# Patient Record
Sex: Female | Born: 2005 | Hispanic: No | Marital: Single | Smoking: Never smoker
Health system: Southern US, Community
[De-identification: ages and names within clinical notes are randomized; demographics above are authoritative.]

## PROBLEM LIST (undated history)

## (undated) ENCOUNTER — Emergency Department: Admission: EM | Payer: MEDICAID

## (undated) DIAGNOSIS — F913 Oppositional defiant disorder: Secondary | ICD-10-CM

## (undated) DIAGNOSIS — F32A Depression, unspecified: Secondary | ICD-10-CM

## (undated) DIAGNOSIS — F419 Anxiety disorder, unspecified: Secondary | ICD-10-CM

## (undated) DIAGNOSIS — F909 Attention-deficit hyperactivity disorder, unspecified type: Secondary | ICD-10-CM

## (undated) DIAGNOSIS — F329 Major depressive disorder, single episode, unspecified: Secondary | ICD-10-CM

---
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
## 2024-01-01 ENCOUNTER — Ambulatory Visit: Payer: MEDICAID | Admitting: Family Medicine

## 2024-01-01 NOTE — Progress Notes (Deleted)
 Established patient visit   Patient: Molly Maldonado   DOB: Aug 17, 2005   18 y.o. Female  MRN: [id number] Visit Date: 01/01/2024  Today's healthcare provider: Mimi Alt, MD   No chief complaint on file.  Subjective       Discussed the use of AI scribe software for clinical note transcription with the patient, who gave verbal consent to proceed.  History of Present Illness      Past Medical History:  Diagnosis Date   ADD (attention deficit disorder with hyperactivity)    Anxiety    Depression    Oppositional disorder     Medications: Outpatient Medications Prior to Visit  Medication Sig   buPROPion  (WELLBUTRIN  XL) 150 MG 24 hr tablet Take 150 mg by mouth daily.   divalproex  (DEPAKOTE  ER) 500 MG 24 hr tablet Take 500 mg by mouth 2 (two) times daily.   folic acid  (FOLVITE ) 800 MCG tablet Take 800 mcg by mouth daily.   guanFACINE  (TENEX ) 1 MG tablet Take 1 tablet (1 mg total) by mouth daily.   hydrOXYzine  (VISTARIL ) 50 MG capsule Take 1 capsule (50 mg total) by mouth 3 (three) times daily as needed.   loratadine  (CLARITIN ) 10 MG tablet Take 1 tablet (10 mg total) by mouth daily as needed for allergies.   medroxyPROGESTERone  (DEPO-PROVERA ) 150 MG/ML injection Inject 150 mg into the muscle every 3 (three) months.   melatonin 5 MG TABS Take 10 mg by mouth at bedtime.   prazosin  (MINIPRESS ) 5 MG capsule Take 1 capsule (5 mg total) by mouth at bedtime.   traZODone  (DESYREL ) 100 MG tablet Take 1 tablet (100 mg total) by mouth at bedtime.   ziprasidone  (GEODON ) 80 MG capsule Take 1 capsule (80 mg total) by mouth daily. Take with a snack or meal.   Facility-Administered Medications Prior to Visit  Medication Dose Route Frequency Provider   medroxyPROGESTERone  Acetate SUSY   Intramuscular Q90 days Simmons-Robinson, Judyann Number, MD    Review of Systems  Last metabolic panel Lab Results  Component Value Date   GLUCOSE 91 12/30/2023   NA 138 12/30/2023   K 3.9  12/30/2023   CL 106 12/30/2023   CO2 21 (L) 12/30/2023   BUN 10 12/30/2023   CREATININE 0.69 12/30/2023   GFRNONAA >60 12/30/2023   CALCIUM 9.3 12/30/2023   PROT 7.7 12/30/2023   ALBUMIN 4.3 12/30/2023   LABGLOB 2.8 10/22/2023   BILITOT 0.9 12/30/2023   ALKPHOS 50 12/30/2023   AST 15 12/30/2023   ALT 15 12/30/2023   ANIONGAP 11 12/30/2023   Last lipids Lab Results  Component Value Date   CHOL 150 10/22/2023   HDL 56 10/22/2023   LDLCALC 81 10/22/2023   TRIG 63 10/22/2023   CHOLHDL 2.7 10/22/2023   Last hemoglobin A1c Lab Results  Component Value Date   HGBA1C 5.1 10/22/2023   Last thyroid  functions Lab Results  Component Value Date   TSH 1.000 10/22/2023   T4TOTAL 7.2 11/08/2014   Last vitamin D No results found for: "25OHVITD2", "25OHVITD3", "VD25OH" Last vitamin B12 and Folate No results found for: "VITAMINB12", "FOLATE"   {See past labs  Heme  Chem  Endocrine  Serology  Results Review (optional):1}   Objective    There were no vitals taken for this visit. BP Readings from Last 3 Encounters:  12/31/23 133/77  10/22/23 97/74  07/09/23 124/76   Wt Readings from Last 3 Encounters:  12/30/23 231 lb 7.7 oz (105 kg) (99%, Z=  2.32)*  10/22/23 232 lb (105.2 kg) (99%, Z= 2.32)*  07/06/23 245 lb (111.1 kg) (>99%, Z= 2.42)*   * Growth percentiles are based on CDC (Girls, 2-20 Years) data.    {See vitals history (optional):1}    Physical Exam  ***  No results found for any visits on 01/01/24.  Assessment & Plan     Problem List Items Addressed This Visit       Other   Stress reaction causing mixed disturbance of emotion and conduct   ADHD (attention deficit hyperactivity disorder), predominantly hyperactive impulsive type - Primary    Assessment and Plan Assessment & Plan      No follow-ups on file.         Mimi Alt, MD  [facility] [phone] (phone) [phone] (fax)  [facility]

## 2024-01-22 ENCOUNTER — Ambulatory Visit: Payer: MEDICAID | Admitting: Family Medicine

## 2024-02-06 ENCOUNTER — Ambulatory Visit: Payer: MEDICAID | Admitting: Physician Assistant

## 2024-02-06 NOTE — Progress Notes (Deleted)
 Established patient visit  Patient: Molly Maldonado   DOB: Mar 14, 2006   18 y.o. Female  MRN: [id number] Visit Date: 02/06/2024  Today's healthcare provider: Jolynn Spencer, PA-C   No chief complaint on file.  Subjective       Discussed the use of AI scribe software for clinical note transcription with the patient, who gave verbal consent to proceed.  History of Present Illness        10/22/2023    2:47 PM  Depression screen PHQ 2/9  Decreased Interest 0  Down, Depressed, Hopeless 0  PHQ - 2 Score 0  Altered sleeping 0  Tired, decreased energy 0  Change in appetite 3  Feeling bad or failure about yourself  1  Trouble concentrating 3  Moving slowly or fidgety/restless 1  Suicidal thoughts 0  PHQ-9 Score 8  Difficult doing work/chores Not difficult at all      10/22/2023    2:47 PM  GAD 7 : Generalized Anxiety Score  Nervous, Anxious, on Edge 1  Control/stop worrying 2  Worry too much - different things 2  Trouble relaxing 2  Restless 2  Easily annoyed or irritable 0  Afraid - awful might happen 0  Total GAD 7 Score 9  Anxiety Difficulty Not difficult at all    Medications: Outpatient Medications Prior to Visit  Medication Sig  . buPROPion  (WELLBUTRIN  XL) 150 MG 24 hr tablet Take 150 mg by mouth daily.  . divalproex  (DEPAKOTE  ER) 500 MG 24 hr tablet Take 500 mg by mouth 2 (two) times daily.  . folic acid  (FOLVITE ) 800 MCG tablet Take 800 mcg by mouth daily.  . guanFACINE  (TENEX ) 1 MG tablet Take 1 tablet (1 mg total) by mouth daily.  . hydrOXYzine  (VISTARIL ) 50 MG capsule Take 1 capsule (50 mg total) by mouth 3 (three) times daily as needed.  . loratadine  (CLARITIN ) 10 MG tablet Take 1 tablet (10 mg total) by mouth daily as needed for allergies.  . medroxyPROGESTERone  (DEPO-PROVERA ) 150 MG/ML injection Inject 150 mg into the muscle every 3 (three) months.  . melatonin 5 MG TABS Take 10 mg by mouth at bedtime.  . prazosin  (MINIPRESS ) 5 MG capsule Take 1 capsule (5  mg total) by mouth at bedtime.  . traZODone  (DESYREL ) 100 MG tablet Take 1 tablet (100 mg total) by mouth at bedtime.  . ziprasidone  (GEODON ) 80 MG capsule Take 1 capsule (80 mg total) by mouth daily. Take with a snack or meal.   Facility-Administered Medications Prior to Visit  Medication Dose Route Frequency Provider  . medroxyPROGESTERone  Acetate SUSY   Intramuscular Q90 days Simmons-Robinson, Makiera, MD    Review of Systems  All other systems reviewed and are negative. All negative Except see HPI   {Insert previous labs (optional):23779} {See past labs  Heme  Chem  Endocrine  Serology  Results Review (optional):1}   Objective    There were no vitals taken for this visit. {Insert last BP/Wt (optional):23777}{See vitals history (optional):1}   Physical Exam Vitals reviewed.  Constitutional:      General: She is not in acute distress.    Appearance: Normal appearance. She is well-developed. She is not diaphoretic.  HENT:     Head: Normocephalic and atraumatic.   Eyes:     General: No scleral icterus.    Conjunctiva/sclera: Conjunctivae normal.   Neck:     Thyroid : No thyromegaly.   Cardiovascular:     Rate and Rhythm: Normal rate and regular rhythm.  Pulses: Normal pulses.     Heart sounds: Normal heart sounds. No murmur heard. Pulmonary:     Effort: Pulmonary effort is normal. No respiratory distress.     Breath sounds: Normal breath sounds. No wheezing, rhonchi or rales.   Musculoskeletal:     Cervical back: Neck supple.     Right lower leg: No edema.     Left lower leg: No edema.  Lymphadenopathy:     Cervical: No cervical adenopathy.   Skin:    General: Skin is warm and dry.     Findings: No rash.   Neurological:     Mental Status: She is alert and oriented to person, place, and time. Mental status is at baseline.   Psychiatric:        Mood and Affect: Mood normal.        Behavior: Behavior normal.     No results found for any visits on  02/06/24.      Assessment and Plan Assessment & Plan     No orders of the defined types were placed in this encounter.   No follow-ups on file.   The patient was advised to call back or seek an in-person evaluation if the symptoms worsen or if the condition fails to improve as anticipated.  I discussed the assessment and treatment plan with the patient. The patient was provided an opportunity to ask questions and all were answered. The patient agreed with the plan and demonstrated an understanding of the instructions.  I, Nery Frappier, PA-C have reviewed all documentation for this visit. The documentation on 02/06/2024  for the exam, diagnosis, procedures, and orders are all accurate and complete.  Jolynn Spencer, [facility], MMS [facility] [phone] (phone) [phone] (fax)  [facility]

## 2024-04-21 ENCOUNTER — Ambulatory Visit (INDEPENDENT_AMBULATORY_CARE_PROVIDER_SITE_OTHER): Payer: MEDICAID | Admitting: Family Medicine

## 2024-04-21 ENCOUNTER — Encounter: Payer: Self-pay | Admitting: Family Medicine

## 2024-04-21 ENCOUNTER — Other Ambulatory Visit (HOSPITAL_COMMUNITY)
Admission: RE | Admit: 2024-04-21 | Discharge: 2024-04-21 | Disposition: A | Discharge status: Home / Self Care | Payer: MEDICAID | Source: Ambulatory Visit | Attending: Family Medicine | Admitting: Family Medicine

## 2024-04-21 VITALS — BP 108/70 | HR 89 | Temp 98.4°F | Ht 69.0 in | Wt 227.4 lb

## 2024-04-21 DIAGNOSIS — R443 Hallucinations, unspecified: Secondary | ICD-10-CM

## 2024-04-21 DIAGNOSIS — F901 Attention-deficit hyperactivity disorder, predominantly hyperactive type: Secondary | ICD-10-CM

## 2024-04-21 DIAGNOSIS — Z6833 Body mass index (BMI) 33.0-33.9, adult: Secondary | ICD-10-CM

## 2024-04-21 DIAGNOSIS — F430 Acute stress reaction: Secondary | ICD-10-CM

## 2024-04-21 DIAGNOSIS — Z9189 Other specified personal risk factors, not elsewhere classified: Secondary | ICD-10-CM

## 2024-04-21 DIAGNOSIS — E66811 Obesity, class 1: Secondary | ICD-10-CM | POA: Insufficient documentation

## 2024-04-21 DIAGNOSIS — F3481 Disruptive mood dysregulation disorder: Secondary | ICD-10-CM

## 2024-04-21 DIAGNOSIS — J3089 Other allergic rhinitis: Secondary | ICD-10-CM

## 2024-04-21 DIAGNOSIS — Z30013 Encounter for initial prescription of injectable contraceptive: Secondary | ICD-10-CM | POA: Diagnosis not present

## 2024-04-21 DIAGNOSIS — F5104 Psychophysiologic insomnia: Secondary | ICD-10-CM

## 2024-04-21 DIAGNOSIS — Z111 Encounter for screening for respiratory tuberculosis: Secondary | ICD-10-CM

## 2024-04-21 LAB — POCT URINE PREGNANCY: Preg Test, Ur: NEGATIVE

## 2024-04-21 MED ORDER — BUPROPION HCL ER (XL) 150 MG PO TB24: 150.0000 mg | ORAL_TABLET | Freq: Every day | ORAL | 2 refills | Status: DC

## 2024-04-21 MED ORDER — ZIPRASIDONE HCL 80 MG PO CAPS: 80.0000 mg | ORAL_CAPSULE | Freq: Every day | ORAL | 0 refills | Status: AC

## 2024-04-21 MED ORDER — HYDROXYZINE PAMOATE 50 MG PO CAPS
50.0000 mg | ORAL_CAPSULE | Freq: Three times a day (TID) | ORAL | 0 refills | Status: AC | PRN
Start: 2024-04-21 — End: ?

## 2024-04-21 MED ORDER — GUANFACINE HCL 1 MG PO TABS: 1.0000 mg | ORAL_TABLET | Freq: Every day | ORAL | 0 refills | Status: DC

## 2024-04-21 MED ORDER — TRAZODONE HCL 100 MG PO TABS: 100.0000 mg | ORAL_TABLET | Freq: Every day | ORAL | 0 refills | Status: DC

## 2024-04-21 MED ORDER — LORATADINE 10 MG PO TABS: 10.0000 mg | ORAL_TABLET | Freq: Every day | ORAL | 3 refills | Status: AC | PRN

## 2024-04-21 MED ORDER — DIVALPROEX SODIUM ER 500 MG PO TB24: 500.0000 mg | ORAL_TABLET | Freq: Two times a day (BID) | ORAL | 2 refills | Status: DC

## 2024-04-21 MED ORDER — PRAZOSIN HCL 5 MG PO CAPS: 5.0000 mg | ORAL_CAPSULE | Freq: Every day | ORAL | 0 refills | Status: DC

## 2024-04-21 NOTE — Patient Instructions (Signed)
 To keep you healthy, please keep in mind the following health maintenance items that you are due for:   Health Maintenance Due  Topic Date Due   HPV VACCINES (1 - 3-dose series) Never done   Meningococcal B Vaccine (1 of 2 - Standard) Never done   DTaP/Tdap/Td (1 - Tdap) Never done     Best Wishes,   Dr. Lang

## 2024-04-21 NOTE — Progress Notes (Signed)
 Established patient visit   Patient: Molly Maldonado   DOB: Jul 08, 2006   18 y.o. Female  MRN: [id number] Visit Date: 04/21/2024  Today's healthcare provider: Rockie Agent, MD   Chief Complaint  Patient presents with   Medication Refill    Patient is needing refills on all medications.     Contraception    Reports that since last depo injection, patient has not gotten her period since. Patient is wanting to restart depo, last inj in March. Needs urine preg Requesting testing for all STI (self swab and blood testing, HIV and HSV 2)   Form Completion    Requesting FL2, advised patient that since she has a lot of concerns we may need to prioritize, would like to discuss med refills and depo/ std and sti testing primarily.    Subjective     HPI     Medication Refill    Additional comments: Patient is needing refills on all medications.          Contraception    Additional comments: Reports that since last depo injection, patient has not gotten her period since. Patient is wanting to restart depo, last inj in March. Needs urine preg Requesting testing for all STI (self swab and blood testing, HIV and HSV 2)        Form Completion    Additional comments: Requesting FL2, advised patient that since she has a lot of concerns we may need to prioritize, would like to discuss med refills and depo/ std and sti testing primarily.       Last edited by Cherry Chiquita HERO, CMA on 04/21/2024 10:13 AM.       Discussed the use of AI scribe software for clinical note transcription with the patient, who gave verbal consent to proceed.  History of Present Illness Molly Maldonado is a 18 year old female with ADHD and disruptive mood dysregulation disorder who presents for chronic follow-up for medication management and requires a psychiatry referral.  She was evaluated in the emergency department for an acute stress reaction on July 6th at [facility], where she was assessed  by the psychiatry emergency service treatment team and discharged home with instructions to establish care with an outpatient behavioral health provider. She has a history of ADHD, disruptive mood dysregulation disorder, hallucinations, and stress reaction causing mixed disturbance of emotion and conduct. In May, she experienced auditory hallucinations and was brought in by the police department on an involuntary commitment due to concerns of shoplifting and expressing suicidal ideations.  Her current medications include Wellbutrin  150 mg daily, Depakote  500 mg twice daily (500 mg in the morning and 1500 mg at night), guanfacine  1 mg daily for ADHD, Vistaril  50 mg three times daily as needed for anxiety, Claritin  10 mg daily for allergic rhinitis, Depo-Provera  for contraception, prazosin  5 mg at bedtime for sleep, trazodone  100 mg at bedtime for sleep, and ziprasidone  80 mg daily with a meal. There is uncertainty about her adherence to these medications as she has not been filled since June. She has not taken her medications for a while and feels she has been 'practically fine' without them. She mentions being more focused and awake since stopping the medications. She is able to sleep without medication, using melatonin instead, although she takes a high dose to achieve her usual effect.  She has not had a menstrual period since her last Depo-Provera  injection six months ago. She is interested in restarting Depo-Provera  and  will complete a urine pregnancy test as part of this process.  She lives in a group home setting but has transitioned to living in an [facility] ([facility]) arrangement, where she resides in a house with 24-hour support. She is not currently engaged in work or school activities but attends a program during the day. No current suicidal ideation, self-harm behaviors, auditory or visual hallucinations, and reports improved focus and wakefulness since discontinuing her  medications.     Past Medical History:  Diagnosis Date   ADD (attention deficit disorder with hyperactivity)    Anxiety    Depression    Oppositional disorder     Medications: Outpatient Medications Prior to Visit  Medication Sig   folic acid  (FOLVITE ) 800 MCG tablet Take 800 mcg by mouth daily.   medroxyPROGESTERone  (DEPO-PROVERA ) 150 MG/ML injection Inject 150 mg into the muscle every 3 (three) months.   melatonin 5 MG TABS Take 10 mg by mouth at bedtime.   [DISCONTINUED] buPROPion  (WELLBUTRIN  XL) 150 MG 24 hr tablet Take 150 mg by mouth daily.   [DISCONTINUED] divalproex  (DEPAKOTE  ER) 500 MG 24 hr tablet Take 500 mg by mouth 2 (two) times daily.   [DISCONTINUED] guanFACINE  (TENEX ) 1 MG tablet Take 1 tablet (1 mg total) by mouth daily.   [DISCONTINUED] hydrOXYzine  (VISTARIL ) 50 MG capsule Take 1 capsule (50 mg total) by mouth 3 (three) times daily as needed.   [DISCONTINUED] loratadine  (CLARITIN ) 10 MG tablet Take 1 tablet (10 mg total) by mouth daily as needed for allergies.   [DISCONTINUED] prazosin  (MINIPRESS ) 5 MG capsule Take 1 capsule (5 mg total) by mouth at bedtime.   [DISCONTINUED] traZODone  (DESYREL ) 100 MG tablet Take 1 tablet (100 mg total) by mouth at bedtime.   [DISCONTINUED] ziprasidone  (GEODON ) 80 MG capsule Take 1 capsule (80 mg total) by mouth daily. Take with a snack or meal.   Facility-Administered Medications Prior to Visit  Medication Dose Route Frequency Provider   medroxyPROGESTERone  Acetate SUSY   Intramuscular Q90 days Simmons-Robinson, Rockie, MD    Review of Systems  Last metabolic panel Lab Results  Component Value Date   GLUCOSE 91 12/30/2023   NA 138 12/30/2023   K 3.9 12/30/2023   CL 106 12/30/2023   CO2 21 (L) 12/30/2023   BUN 10 12/30/2023   CREATININE 0.69 12/30/2023   GFRNONAA >60 12/30/2023   CALCIUM 9.3 12/30/2023   PROT 7.7 12/30/2023   ALBUMIN 4.3 12/30/2023   LABGLOB 2.8 10/22/2023   BILITOT 0.9 12/30/2023   ALKPHOS 50  12/30/2023   AST 15 12/30/2023   ALT 15 12/30/2023   ANIONGAP 11 12/30/2023   Last lipids Lab Results  Component Value Date   CHOL 150 10/22/2023   HDL 56 10/22/2023   LDLCALC 81 10/22/2023   TRIG 63 10/22/2023   CHOLHDL 2.7 10/22/2023   Last hemoglobin A1c Lab Results  Component Value Date   HGBA1C 5.1 10/22/2023   Last thyroid  functions Lab Results  Component Value Date   TSH 1.000 10/22/2023   T4TOTAL 7.2 11/08/2014     \   Objective    BP 108/70 (BP Location: Left Arm, Patient Position: Sitting, Cuff Size: Normal)   Pulse 89   Temp 98.4 F (36.9 C) (Oral)   Ht 5' 9 (1.753 m)   Wt 227 lb 6.4 oz (103.1 kg)   SpO2 98%   BMI 33.58 kg/m  BP Readings from Last 3 Encounters:  04/21/24 108/70  12/31/23 133/77  10/22/23 97/74  Wt Readings from Last 3 Encounters:  04/21/24 227 lb 6.4 oz (103.1 kg) (99%, Z= 2.28)*  12/30/23 231 lb 7.7 oz (105 kg) (99%, Z= 2.32)*  10/22/23 232 lb (105.2 kg) (99%, Z= 2.32)*   * Growth percentiles are based on CDC (Girls, 2-20 Years) data.        Physical Exam Vitals reviewed.  Constitutional:      General: She is not in acute distress.    Appearance: Normal appearance. She is not ill-appearing.  Cardiovascular:     Rate and Rhythm: Normal rate and regular rhythm.  Pulmonary:     Effort: Pulmonary effort is normal. No respiratory distress.     Breath sounds: No wheezing, rhonchi or rales.  Neurological:     Mental Status: She is alert and oriented to person, place, and time.  Psychiatric:        Attention and Perception: She is inattentive.        Mood and Affect: Mood normal. Affect is not flat.        Speech: Speech normal.        Behavior: Behavior is cooperative.        Thought Content: Thought content does not include suicidal ideation. Thought content does not include suicidal plan.     Comments: Decreased eye contact        No results found for any visits on 04/21/24.  Assessment & Plan     Problem List  Items Addressed This Visit     ADHD (attention deficit hyperactivity disorder), predominantly hyperactive impulsive type - Primary   Relevant Medications   guanFACINE  (TENEX ) 1 MG tablet   Other Relevant Orders   Ambulatory referral to Psychiatry   Class 1 obesity without serious comorbidity with body mass index (BMI) of 33.0 to 33.9 in adult   Relevant Orders   TSH + free T4   CMP14+EGFR   Hemoglobin A1c   Vitamin B12   Lipid panel   DMDD (disruptive mood dysregulation disorder) (HCC)   Relevant Medications   ziprasidone  (GEODON ) 80 MG capsule   hydrOXYzine  (VISTARIL ) 50 MG capsule   divalproex  (DEPAKOTE  ER) 500 MG 24 hr tablet   buPROPion  (WELLBUTRIN  XL) 150 MG 24 hr tablet   Hallucinations   Stress reaction causing mixed disturbance of emotion and conduct   Relevant Medications   traZODone  (DESYREL ) 100 MG tablet   hydrOXYzine  (VISTARIL ) 50 MG capsule   buPROPion  (WELLBUTRIN  XL) 150 MG 24 hr tablet   Other Relevant Orders   Ambulatory referral to Psychiatry   Other Visit Diagnoses       Encounter for initial prescription of injectable contraceptive       Relevant Orders   POCT urine pregnancy     Screening-pulmonary TB       Relevant Orders   QuantiFERON-TB Gold Plus     History of sexual behavior with high risk of exposure to communicable disease       Relevant Orders   HIV antibody (with reflex)   POCT urine pregnancy   RPR   Cervicovaginal ancillary only     Seasonal allergic rhinitis due to other allergic trigger       Relevant Medications   loratadine  (CLARITIN ) 10 MG tablet     Psychophysiological insomnia       Relevant Medications   traZODone  (DESYREL ) 100 MG tablet   prazosin  (MINIPRESS ) 5 MG capsule       Assessment and Plan Assessment & Plan Chronic psychiatric conditions including ADHD,  disruptive mood dysregulation disorder, hallucinations, and chronic suicidal ideation Evaluated in the ED for acute stress reaction in July. No current suicidal  ideation or hallucinations. Not taking prescribed psychiatric medications consistently. Expressed feeling fine without medications. Needs urgent psychiatric referral for medication management and behavioral health support. - Submit urgent referral to psychiatry - Refill medications: ziprasidone , trazodone , prazosin , Claritin , hydroxyzine , guanfacine   Contraceptive management with Depo-Provera  Last injection was in March, and she has not had a period since. Interested in restarting Depo-Provera . Needs a negative pregnancy test before restarting. - Perform urine pregnancy test - Restart Depo-Provera  injections upon negative pregnancy test  Obesity, class 1 Class 1 obesity with a BMI on the higher side. Weight has decreased from 232 lbs in March to 227 lbs currently. Discussed dietary habits, including high sugar intake. Plan to monitor weight and metabolic health. - Check metabolic panel, A1c, B12, and lipid levels - Monitor weight and dietary habits  Allergic rhinitis Symptoms of itchy, watery eyes, especially during seasonal changes. No recent flare-ups reported. - Refill Claritin  10 mg daily    After conclusion of visit, patient declines Depo injection, states that she would rather have her menses and DOES NOT want to be on Depo   Return in about 3 months (around 07/21/2024) for Chronic F/U.         Rockie Agent, MD  [facility] [phone] (phone) [phone] (fax)  [facility]

## 2024-04-22 ENCOUNTER — Ambulatory Visit: Payer: Self-pay | Admitting: Family Medicine

## 2024-04-22 LAB — CERVICOVAGINAL ANCILLARY ONLY
Bacterial Vaginitis (gardnerella): NEGATIVE
Candida Glabrata: NEGATIVE
Candida Vaginitis: NEGATIVE
Chlamydia: NEGATIVE
Comment: NEGATIVE
Comment: NEGATIVE
Comment: NEGATIVE
Comment: NEGATIVE
Comment: NEGATIVE
Comment: NORMAL
Neisseria Gonorrhea: NEGATIVE
Trichomonas: NEGATIVE

## 2024-04-23 ENCOUNTER — Telehealth: Payer: Self-pay

## 2024-04-23 DIAGNOSIS — F3481 Disruptive mood dysregulation disorder: Secondary | ICD-10-CM

## 2024-04-23 DIAGNOSIS — F5104 Psychophysiologic insomnia: Secondary | ICD-10-CM

## 2024-04-23 NOTE — Telephone Encounter (Signed)
 Copied from CRM #[id number]. Topic: Clinical - Medication Question >> Apr 23, 2024  1:13 PM DeAngela L wrote: Reason for CRM:  Adilya calling from the group home the patient lives at to ask the doctor if the patient is still going to be taking folic acid  (FOLVITE ) 800 MCG tablet and also melatonin 5 MG TABS If the patient is still going to take these meds they will need a prescription for them since she is in a group home  Or if the patient was removed from taking these meds the need this confirmed for records   Pt num [phone]

## 2024-04-24 LAB — QUANTIFERON-TB GOLD PLUS
QuantiFERON Mitogen Value: >10 [IU]/mL
QuantiFERON Nil Value: 0.04 [IU]/mL
QuantiFERON TB1 Ag Value: 0.04 [IU]/mL
QuantiFERON TB2 Ag Value: 0.03 [IU]/mL

## 2024-04-24 LAB — LIPID PANEL
Chol/HDL Ratio: 3.1 ratio (ref 0.0–4.4)
Cholesterol, Total: 160 mg/dL (ref 100–169)
HDL: 52 mg/dL (ref >39)
LDL Chol Calc (NIH): 88 mg/dL (ref 0–109)
Triglycerides: 112 mg/dL — ABNORMAL HIGH (ref 0–89)
VLDL Cholesterol Cal: 20 mg/dL (ref 5–40)

## 2024-04-24 LAB — CMP14+EGFR
ALT: 18 IU/L (ref 0–32)
AST: 15 IU/L (ref 0–40)
Albumin: 4.5 g/dL (ref 4.0–5.0)
Alkaline Phosphatase: 88 IU/L (ref 42–106)
BUN/Creatinine Ratio: 15 (ref 9–23)
BUN: 9 mg/dL (ref 6–20)
Bilirubin Total: 0.7 mg/dL (ref 0.0–1.2)
CO2: 20 mmol/L (ref 20–29)
Calcium: 9.3 mg/dL (ref 8.7–10.2)
Chloride: 103 mmol/L (ref 96–106)
Creatinine, Ser: 0.6 mg/dL (ref 0.57–1.00)
Globulin, Total: 2.9 g/dL (ref 1.5–4.5)
Glucose: 84 mg/dL (ref 70–99)
Potassium: 4.4 mmol/L (ref 3.5–5.2)
Sodium: 136 mmol/L (ref 134–144)
Total Protein: 7.4 g/dL (ref 6.0–8.5)
eGFR: 133 mL/min/1.73 (ref >59)

## 2024-04-24 LAB — TSH+FREE T4
Free T4: 1.12 ng/dL (ref 0.93–1.60)
TSH: 1.46 u[IU]/mL (ref 0.450–4.500)

## 2024-04-24 LAB — HEMOGLOBIN A1C
Est. average glucose Bld gHb Est-mCnc: 91 mg/dL
Hgb A1c MFr Bld: 4.8 % (ref 4.8–5.6)

## 2024-04-24 LAB — HIV ANTIBODY (ROUTINE TESTING W REFLEX): HIV Screen 4th Generation wRfx: NONREACTIVE

## 2024-04-24 LAB — RPR: RPR Ser Ql: NONREACTIVE

## 2024-04-24 LAB — VITAMIN B12: Vitamin B-12: 503 pg/mL (ref 232–1245)

## 2024-04-26 MED ORDER — MELATONIN 10 MG PO TABS: 10.0000 mg | ORAL_TABLET | Freq: Every day | ORAL | 3 refills | Status: AC

## 2024-04-26 MED ORDER — FOLIC ACID 800 MCG PO TABS: 800.0000 ug | ORAL_TABLET | Freq: Every day | ORAL | 3 refills | Status: AC

## 2024-04-26 NOTE — Addendum Note (Signed)
 Addended by: SIMMONS-ROBINSON, Reita Shindler L on: 04/26/2024 09:31 AM   Modules accepted: Orders

## 2024-06-23 ENCOUNTER — Other Ambulatory Visit: Payer: Self-pay | Admitting: Family Medicine

## 2024-06-23 DIAGNOSIS — F5104 Psychophysiologic insomnia: Secondary | ICD-10-CM

## 2024-06-23 DIAGNOSIS — F901 Attention-deficit hyperactivity disorder, predominantly hyperactive type: Secondary | ICD-10-CM

## 2024-07-21 ENCOUNTER — Ambulatory Visit: Payer: MEDICAID | Admitting: Family Medicine

## 2024-07-21 ENCOUNTER — Encounter: Payer: Self-pay | Admitting: Family Medicine

## 2024-07-21 ENCOUNTER — Telehealth: Payer: Self-pay

## 2024-07-21 VITALS — BP 113/75 | HR 80 | Ht 69.0 in | Wt 236.8 lb

## 2024-07-21 DIAGNOSIS — F5104 Psychophysiologic insomnia: Secondary | ICD-10-CM | POA: Diagnosis not present

## 2024-07-21 DIAGNOSIS — F430 Acute stress reaction: Secondary | ICD-10-CM

## 2024-07-21 DIAGNOSIS — F901 Attention-deficit hyperactivity disorder, predominantly hyperactive type: Secondary | ICD-10-CM

## 2024-07-21 DIAGNOSIS — E66811 Obesity, class 1: Secondary | ICD-10-CM

## 2024-07-21 DIAGNOSIS — F3481 Disruptive mood dysregulation disorder: Secondary | ICD-10-CM | POA: Diagnosis not present

## 2024-07-21 DIAGNOSIS — Z6833 Body mass index (BMI) 33.0-33.9, adult: Secondary | ICD-10-CM | POA: Diagnosis not present

## 2024-07-21 MED ORDER — TRAZODONE HCL 150 MG PO TABS: 150.0000 mg | ORAL_TABLET | Freq: Every day | ORAL | 1 refills | Status: AC

## 2024-07-21 NOTE — Progress Notes (Signed)
 Established patient visit   Patient: Molly Maldonado   DOB: 23-Apr-2006   18 y.o. Female  MRN: [id number] Visit Date: 07/21/2024  Today's healthcare provider: Rockie Agent, MD   Chief Complaint  Patient presents with   Medical Management of Chronic Issues    Patient is present for follow up with pcp. Doing well overall, some concerns with trazodone - Sleep is affected by this  Caregiver explains that patient has had some problems with walking off and the police had been called to track her down    Subjective     HPI     Medical Management of Chronic Issues    Additional comments: Patient is present for follow up with pcp. Doing well overall, some concerns with trazodone - Sleep is affected by this  Caregiver explains that patient has had some problems with walking off and the police had been called to track her down       Last edited by Cherry Chiquita HERO, CMA on 07/21/2024 10:27 AM.       Discussed the use of AI scribe software for clinical note transcription with the patient, who gave verbal consent to proceed.  History of Present Illness Molly Maldonado is a 18 year old female with chronic attention deficit disorder, anxiety, depression, and history of oppositional defiant disorder who presents for follow-up.  She has not yet seen a psychiatrist despite a referral made in September. Her current medications include Wellbutrin  150 mg for ADD, guanfacine  1 mg daily for ADD, and hydroxyzine  50 mg three times daily as needed for anxiety. She is also on Depakote  500 mg twice daily as a mood stabilizer, mirtazapine 5 mg at bedtime, trazodone  100 mg at bedtime for sleep, and Geodon  80 mg daily for depression.  She experiences disrupted sleep and is considering an increase in trazodone  to 150 mg. She notes that changes in her medication regimen often lead to increased 'crazy' behavior, indicating negative side effects.  She reports an incident involving a person named Sierra,  which led to feelings of anger and aggression. She has been practicing coping skills such as walking away to manage her emotions.  She mentions an increased appetite, which she attributes to her medications, and has gained 9 pounds in the last 8 weeks. She is not currently using Depo-Premier for contraception.  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 She has class one obesity with a BMI of 34.     Past Medical History:  Diagnosis Date   ADD (attention deficit disorder with hyperactivity)    Anxiety    Depression    Oppositional disorder     Medications: Outpatient Medications Prior to Visit  Medication Sig   buPROPion  (WELLBUTRIN  XL) 150 MG 24 hr tablet Take 1 tablet (150 mg total) by mouth daily.   divalproex  (DEPAKOTE  ER) 500 MG 24 hr tablet Take 1 tablet (500 mg total) by mouth 2 (two) times daily.   folic acid  (FOLVITE ) 800 MCG tablet Take 1 tablet (800 mcg total) by mouth daily.   guanFACINE  (TENEX ) 1 MG tablet TAKE 1 TABLET BY MOUTH ONCE DAILY   hydrOXYzine  (VISTARIL ) 50 MG capsule Take 1 capsule (50 mg total) by mouth 3 (three) times daily as needed.   loratadine  (CLARITIN ) 10 MG tablet Take 1 tablet (10 mg total) by mouth daily as needed for allergies.   melatonin 10 MG TABS Take 10 mg by mouth at bedtime.   prazosin  (MINIPRESS ) 5  MG capsule TAKE 1 CAPSULE BY MOUTH EVERY NIGHT AT BEDTIME   ziprasidone  (GEODON ) 80 MG capsule Take 1 capsule (80 mg total) by mouth daily. Take with a snack or meal.   [DISCONTINUED] traZODone  (DESYREL ) 100 MG tablet TAKE 1 TABLET BY MOUTH EVERY NIGHT AT BEDTIME   medroxyPROGESTERone  (DEPO-PROVERA ) 150 MG/ML injection Inject 150 mg into the muscle every 3 (three) months. (Patient not taking: Reported on 07/21/2024)   Facility-Administered Medications Prior to Visit  Medication Dose Route Frequency Provider   medroxyPROGESTERone  Acetate SUSY   Intramuscular Q90 days Simmons-Robinson, Yarenis Cerino, MD     Review of Systems      Objective    BP 113/75 (BP Location: Right Arm, Patient Position: Sitting, Cuff Size: Normal)   Pulse 80   Ht 5' 9 (1.753 m)   Wt 236 lb 12.8 oz (107.4 kg)   SpO2 100%   BMI 34.97 kg/m      Physical Exam Vitals reviewed.  Constitutional:      General: She is not in acute distress.    Appearance: Normal appearance. She is not ill-appearing.  Cardiovascular:     Rate and Rhythm: Normal rate and regular rhythm.  Pulmonary:     Effort: Pulmonary effort is normal. No respiratory distress.     Breath sounds: No wheezing, rhonchi or rales.  Neurological:     Mental Status: She is alert and oriented to person, place, and time.  Psychiatric:        Attention and Perception: Attention normal.        Mood and Affect: Mood normal. Affect is flat.        Speech: Speech normal.        Behavior: Behavior is hyperactive.        Thought Content: Thought content normal.        Judgment: Judgment is impulsive.       No results found for any visits on 07/21/24.  Assessment & Plan     Problem List Items Addressed This Visit     ADHD (attention deficit hyperactivity disorder), predominantly hyperactive impulsive type - Primary   Relevant Orders   Ambulatory referral to Psychiatry   Class 1 obesity without serious comorbidity with body mass index (BMI) of 33.0 to 33.9 in adult   DMDD (disruptive mood dysregulation disorder)   Relevant Orders   Ambulatory referral to Psychiatry   Stress reaction causing mixed disturbance of emotion and conduct   Relevant Medications   traZODone  (DESYREL ) 150 MG tablet   Other Visit Diagnoses       Psychophysiological insomnia       Relevant Medications   traZODone  (DESYREL ) 150 MG tablet   Other Relevant Orders   Ambulatory referral to Psychiatry       Assessment and Plan Assessment & Plan Psychophysiologic insomnia Chronic insomnia with disrupted sleep despite current trazodone  regimen. Reports increased  sleep disturbances and negative side effects with medication changes. - Increased trazodone  to 150 mg at bedtime. - Scheduled follow-up appointment to assess response to increased trazodone  dosage.  Attention-deficit hyperactivity disorder, predominantly hyperactive type Chronic ADHD managed with Wellbutrin  150mg  every day and guanfacine  1mg  daily . No recent changes in medication regimen. - continue current medications   Disruptive mood dysregulation disorder Chronic mood dysregulation managed with Depakote  and Geodon . Reports recent emotional distress and coping challenges. - Will follow up on psychiatry referral to ensure continuity of care.  Obesity, class 1 Class 1 obesity with a BMI of 34. Reports increased  appetite potentially related to medication effects. - Encouraged portion control and healthy eating habits.     Return in about 2 months (around 09/21/2024) for Insomnia.         Rockie Agent, MD  [facility] [phone] (phone) [phone] (fax)  [facility]

## 2024-07-21 NOTE — Telephone Encounter (Signed)
 Called and spoke with pharmacy and verified patient's new dose is 150mg 

## 2024-07-21 NOTE — Telephone Encounter (Signed)
 Copied from CRM #[id number]. Topic: Clinical - Prescription Issue >> Jul 21, 2024 11:23 AM Delon DASEN wrote: Reason for CRM: Kadima with [facility] needs clarification for traZODone  (DESYREL ) 150 MG tablet-  [phone]

## 2024-07-21 NOTE — Patient Instructions (Addendum)
°  VISIT SUMMARY: You came in for a follow-up visit to discuss your ongoing conditions, including ADHD, anxiety, depression, and sleep issues. We adjusted your medication for sleep and discussed your recent emotional challenges and weight gain.  YOUR PLAN: PSYCHOPHYSIOLOGIC INSOMNIA: You have chronic insomnia with disrupted sleep despite your current trazodone  regimen. -We increased your trazodone  dose to 150 mg at bedtime. -We scheduled a follow-up appointment to see how you respond to the increased trazodone  dosage.  ATTENTION-DEFICIT HYPERACTIVITY DISORDER (ADHD): You have chronic ADHD managed with Wellbutrin  and guanfacine . -No changes were made to your ADHD medications.  DISRUPTIVE MOOD DYSREGULATION DISORDER: You have chronic mood dysregulation managed with Depakote  and Geodon . You reported recent emotional distress and coping challenges. -We will follow up on your psychiatry referral to ensure you get the care you need.  OBESITY, CLASS 1: You have class 1 obesity with a BMI of 34 and have reported increased appetite, possibly due to your medications. -We encouraged you to practice portion control and adopt healthy eating habits.                      Contains text generated by [employer].                                 Contains text generated by [employer].     To keep you healthy, please keep in mind the following health maintenance items that you are due for:   Health Maintenance Due  Topic Date Due   Meningococcal B Vaccine (1 of 2 - Standard) Never done     Best Wishes,   Dr. Lang

## 2024-07-26 ENCOUNTER — Ambulatory Visit (HOSPITAL_COMMUNITY)
Admission: EM | Admit: 2024-07-26 | Discharge: 2024-07-26 | Disposition: A | Discharge status: Home / Self Care | Payer: MEDICAID | Attending: Psychiatry | Admitting: Psychiatry

## 2024-07-26 DIAGNOSIS — F3481 Disruptive mood dysregulation disorder: Secondary | ICD-10-CM

## 2024-07-26 DIAGNOSIS — Z79899 Other long term (current) drug therapy: Secondary | ICD-10-CM | POA: Insufficient documentation

## 2024-07-26 DIAGNOSIS — F332 Major depressive disorder, recurrent severe without psychotic features: Secondary | ICD-10-CM | POA: Insufficient documentation

## 2024-07-26 MED ORDER — BUPROPION HCL ER (XL) 150 MG PO TB24: 300.0000 mg | ORAL_TABLET | Freq: Every day | ORAL | 0 refills | Status: AC

## 2024-07-26 NOTE — Discharge Instructions (Signed)
 Increase Wellbutrin  to 300 mg with plan to followup with her PCP within 2 weeks.  Follow-up recommendations:  Activity:  Normal, as tolerated Diet:  Per PCP recommendation  Patient is instructed prior to discharge to: Take all medications as prescribed by her mental healthcare provider. Report any adverse effects and/or reactions from the medicines to her outpatient provider promptly. Patient has been instructed & cautioned: To not engage in alcohol and or illegal drug use while on prescription medicines.  In the event of worsening symptoms, patient is instructed to call the crisis hotline at 988, 911 and or go to the nearest ED for appropriate evaluation and treatment of symptoms. To follow-up with her primary care provider for your other medical issues, concerns and or health care needs.

## 2024-07-26 NOTE — ED Provider Notes (Signed)
 Behavioral Health Urgent Care Medical Screening Exam  Patient Name: Molly Maldonado MRN: [id number] Date of Evaluation: 07/26/2024 Chief Complaint:  I think my meds need to be adjusted Diagnosis:  Final diagnoses:  Severe episode of recurrent major depressive disorder, without psychotic features (HCC)    History of Present illness: Molly Maldonado is a 18 y.o. female with a past psychiatric history of MDD, ODD currently taking Wellbutrin  150 mg daily, guanfacine  1 mg daily, and trazodone  150 mg with medications being managed by Dr. Sharma who presents to the [facility] accompanied by a residential facility staff member due to wanting medication consultation.  Per triage note, Pt presents to [facility] as a voluntary walk-in, accompanied by residential home staff [facility]) requesting medication consultation and complaint of anxiety/panic attack. Pt states  my home staff told me to tell ya'll I wanted to kill myself. Pt denies SI. Per residential staff, las night pt was upset and threatened to kill herself if she had to go to her room. Pt admits to saying this, but expressed that she did not mean it and was just upset. Per chart review, pt has history of SI, ODD, and behavioral concerns. Pt is established with [facility]) for medication management. Pt denies seeing outpatient therapist at this time. Pt currently denies SI,HI,AVH and substance/alcohol use.   She reports going to her PCP 5 days ago in which her trazodone  was increased to 150 but she feels like it is making her too sleepy.  She believes that she took a total of 250 mg trazodone  last night due to accidentally taking a 150 mg tablet and a 100 mg tablet in a bubble pack.  I discussed that she can keep taking 100 mg of trazodone  and if she is not sleeping then she can take an extra 50 mg and see the effect.  When asked about the statements that she wanted to kill herself, she states that  she made the statement because she was angry at her roommate and she did not mean it.  She does admit to making statements that she will self-harm but states that she did not mean this as well.  She denies acting on her statements of self-harm.  She contracts for safety.  She currently denies SI, HI, and AVH.  She feels that she does not know how to manage her emotions properly and she is open to establishing with a therapist. We discussed a safety plan including warning signs, coping strategies, and possible people to contact and resources to utilize if suicidal ideation worsens.   I spoke with patient's legal guardian Mae Essex [phone]) and she states that patient has a pattern of poor emotional regulation. She saw her father 3 weeks ago in which he stopped her medication for 1 week because he does not believe that she needs medications.  She then withdrew his visitation rights after this.  She states that she last made statements that she wanted to kill herself 4 days ago.  She states that the most recent time that patient had self injurious behavior was July 2025.  She states that the patient can get established with a therapist who she can see every 2 weeks.  I discussed that we can increase patient's Wellbutrin  from 150 mg to 300 mg with the plan to see her PCP within 2 weeks.  I discussed that she should get established with a psychiatrist for medication management moving forward and she is agreeable.  She denies having a safety concern at this time for the patient to return back to live at her apartment.  Flowsheet Row ED from 07/26/2024 in [facility] ED from 12/30/2023 in [facility] Emergency Department at [facility] ED from 07/06/2023 in [facility] Emergency Department at [facility]  C-SSRS RISK CATEGORY Low Risk No Risk No Risk    Psychiatric Specialty Exam  Presentation  General Appearance:Appropriate for Environment; Fairly  Groomed  Eye Contact:Fair  Speech:Clear and Coherent; Normal Rate  Speech Volume:Normal  Handedness:Right   Mood and Affect  Mood:Depressed  Affect:Congruent   Thought Process  Thought Processes:Coherent; Linear; Goal Directed  Descriptions of Associations:Intact  Orientation:Full (Time, Place and Person)  Thought Content:Logical; WDL  Diagnosis of Schizophrenia or Schizoaffective disorder in past: no Duration of Psychotic Symptoms: Greater than six months  Hallucinations:None  Ideas of Reference:None  Suicidal Thoughts:No  Homicidal Thoughts:No   Sensorium  Memory:Remote Good  Judgment:Fair  Insight:Fair   Executive Functions  Concentration:Good  Attention Span:Good  Recall:Good  Fund of Knowledge:Good  Language:Good   Psychomotor Activity  Psychomotor Activity:Normal   Assets  Assets:Communication Skills; Resilience   Sleep  Sleep:Fair  Number of hours: No data recorded  Physical Exam: Physical Exam Vitals reviewed.  Constitutional:      Appearance: Normal appearance.  HENT:     Head: Normocephalic and atraumatic.  Cardiovascular:     Rate and Rhythm: Normal rate.  Pulmonary:     Effort: Pulmonary effort is normal.  Neurological:     General: No focal deficit present.     Mental Status: She is alert and oriented to person, place, and time.    Review of Systems  Constitutional:  Negative for chills and fever.  Respiratory:  Negative for shortness of breath.   Cardiovascular:  Negative for chest pain and palpitations.  Gastrointestinal:  Negative for nausea and vomiting.  Neurological:  Negative for headaches.   Blood pressure 118/84, pulse 78, temperature (!) 97.4 F (36.3 C), temperature source Oral, resp. rate 18, SpO2 98%. There is no height or weight on file to calculate BMI.  Musculoskeletal: Strength & Muscle Tone: within normal limits Gait & Station: normal Patient leans: N/A   [facility] MSE Discharge Disposition  for Follow up and Recommendations: Based on my evaluation the patient does not appear to have an emergency medical condition and can be discharged with resources and follow up care in outpatient services for Medication Management   Ismael KATHEE Franco, MD 07/26/2024, 9:10 PM

## 2024-07-26 NOTE — Progress Notes (Signed)
°   07/26/24 1913  [facility] Triage Screening (Walk-ins at [facility] only)  How Did You Hear About Us ? Other (Comment)  What Is the Reason for Your Visit/Call Today? Pt presents to [facility] as a voluntary walk-in, accompanied by residential home staff [facility]) requesting medication consultation and complaint of anxiety/panic attack. Pt states  my home staff told me to tell ya'll I wanted to kill myself. Pt denies SI. Per residential staff, las night pt was upset and threatened to kill herself if she had to go to her room. Pt admits to saying this, but expressed that she did not mean it and was just upset. Per chart review, pt has history of SI, ODD, and behavioral concerns. Pt is established with [facility]) for medication management. Pt denies seeing outpatient therapist at this time.  Pt currently denies SI,HI,AVH and substance/alcohol use.  How Long Has This Been Causing You Problems? <Week  Have You Recently Had Any Thoughts About Hurting Yourself? No  Are You Planning to Commit Suicide/Harm Yourself At This time? No  Have you Recently Had Thoughts About Hurting Someone Sherral? No  Are You Planning To Harm Someone At This Time? No  Physical Abuse Denies  Verbal Abuse Denies  Sexual Abuse Denies  Exploitation of patient/patient's resources Denies  Self-Neglect Denies  Are you currently experiencing any auditory, visual or other hallucinations? No  Have You Used Any Alcohol or Drugs in the Past 24 Hours? No  Do you have any current medical co-morbidities that require immediate attention? No  Clinician description of patient physical appearance/behavior: Cooperative, calm, oriented  What Do You Feel Would Help You the Most Today? Medication(s);Social Support;Stress Management;Treatment for Depression or other mood problem  If access to [facility] was not available, would you have sought care in the Emergency Department? No  Determination of Need  Routine (7 days)  Options For Referral Other: Comment;Outpatient Therapy;Medication Management

## 2024-08-14 ENCOUNTER — Other Ambulatory Visit: Payer: Self-pay

## 2024-08-14 ENCOUNTER — Emergency Department
Admission: EM | Admit: 2024-08-14 | Discharge: 2024-08-14 | Disposition: A | Discharge status: Home / Self Care | Payer: MEDICAID | Attending: Emergency Medicine | Admitting: Emergency Medicine

## 2024-08-14 DIAGNOSIS — R1032 Left lower quadrant pain: Secondary | ICD-10-CM | POA: Insufficient documentation

## 2024-08-14 DIAGNOSIS — R109 Unspecified abdominal pain: Secondary | ICD-10-CM | POA: Diagnosis present

## 2024-08-14 NOTE — Discharge Instructions (Signed)
 If your pain returns and does not go away then please return to the ED

## 2024-08-14 NOTE — ED Provider Notes (Signed)
 "  [facility] Provider Note    Event Date/Time   First MD Initiated Contact with Patient 08/14/24 1617     (approximate)   History   Abdominal Pain   HPI  Molly Maldonado is a 19 y.o. female who presents to the ED for evaluation of Abdominal Pain   Review a psychiatry clinic visit from 2 weeks ago.  History of MDD, ODD, resides at a local group home.  No abdominal surgical history.  Patient presents to the ED with group home staff for evaluation of an episode of abdominal pain that is since resolved.  She reports this morning while seated on the toilet passing stool she had a brief episode of sharp LLQ abdominal pain that self resolved and has not recurred.  Reports the stool was normal without blood or diarrhea, no emesis, fevers, urinary symptoms or other concerns.  Reports she feels fine and is asking for apple juice.   Physical Exam   Triage Vital Signs: ED Triage Vitals [08/14/24 1547]  Encounter Vitals Group     BP (!) 135/109     Girls Systolic BP Percentile      Girls Diastolic BP Percentile      Boys Systolic BP Percentile      Boys Diastolic BP Percentile      Pulse Rate 98     Resp 20     Temp 98.1 F (36.7 C)     Temp Source Oral     SpO2 98 %     Weight      Height      Head Circumference      Peak Flow      Pain Score 0     Pain Loc      Pain Education      Exclude from Growth Chart     Most recent vital signs: Vitals:   08/14/24 1547  BP: (!) 135/109  Pulse: 98  Resp: 20  Temp: 98.1 F (36.7 C)  SpO2: 98%    General: Awake, no distress.  Obese, well-appearing CV:  Good peripheral perfusion.  Resp:  Normal effort.  Abd:  No distention.  Soft and nontender throughout MSK:  No deformity noted.  Neuro:  No focal deficits appreciated. Other:     ED Results / Procedures / Treatments   Labs (all labs ordered are listed, but only abnormal results are displayed) Labs Reviewed - No data to  display  EKG   RADIOLOGY   Official radiology report(s): No results found.  PROCEDURES and INTERVENTIONS:  Procedures  Medications - No data to display   IMPRESSION / MDM / ASSESSMENT AND PLAN / ED COURSE  I reviewed the triage vital signs and the nursing notes.  Differential diagnosis includes, but is not limited to, diverticulitis, ureteral stone, viral syndrome, abdominal cramping, constipation  {Patient presents with symptoms of an acute illness or injury that is potentially life-threatening.  Patient presents after a brief self-resolving episode of sharp LLQ discomfort with normal exam and suitable for outpatient management with return precautions.  Normal vitals, normal exam, asymptomatic.  No barriers to outpatient management.  Discussed return precautions      FINAL CLINICAL IMPRESSION(S) / ED DIAGNOSES   Final diagnoses:  Left lower quadrant abdominal pain     Rx / DC Orders   ED Discharge Orders     None        Note:  This document was prepared using Dragon voice recognition software  and may include unintentional dictation errors.   Claudene Rover, MD 08/14/24 [phone]  "

## 2024-08-14 NOTE — ED Triage Notes (Addendum)
 Pt to ED via POV from [facility]. Pt with caregiver. Pt reports was having a BM and when she went to push had pain to LLQ. Pt reports after BM pain is gone. Pt reports being sick last week. Denies N/V or fevers.   Does not want blood drawn in triage

## 2024-08-15 ENCOUNTER — Emergency Department: Payer: MEDICAID

## 2024-08-15 ENCOUNTER — Emergency Department
Admission: EM | Admit: 2024-08-15 | Discharge: 2024-08-15 | Disposition: A | Discharge status: Home / Self Care | Payer: MEDICAID | Attending: Emergency Medicine | Admitting: Emergency Medicine

## 2024-08-15 DIAGNOSIS — M79641 Pain in right hand: Secondary | ICD-10-CM | POA: Insufficient documentation
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
## 2024-08-15 NOTE — ED Notes (Addendum)
 The pt advised when asked where she lives the pt stated she lives at [address] [place] the road. The pt denied living in a group home. The pt advised she lived in a town home. I attempted to call the legal guardian without success, unable to leave a message. Pt then left the room.

## 2024-08-15 NOTE — ED Notes (Signed)
 [employer] notified at 1915 about this patient being discharged and left the building. Pt is from a group home. Staff is also here as a patient and saw the patient leave and states she runs away all of the time.
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
## 2024-08-15 NOTE — Discharge Instructions (Addendum)
 Pain control:  Ibuprofen  (motrin /aleve /advil ) - You can take 3 tablets (600 mg) every 6 hours as needed for pain.  Acetaminophen  (tylenol ) - You can take 2 extra strength tablets (1000 mg) every 6 hours as needed for pain.  You can alternate these medications or take them together.  Make sure you eat food/drink water when taking these medications.  Apply ice to the affected area.  Follow-up with orthopedics if symptoms do not improve.  If any new or worsening symptoms return to ED.

## 2024-08-19 ENCOUNTER — Other Ambulatory Visit: Payer: Self-pay | Admitting: Family Medicine

## 2024-08-19 DIAGNOSIS — F3481 Disruptive mood dysregulation disorder: Secondary | ICD-10-CM

## 2024-10-13 ENCOUNTER — Ambulatory Visit: Payer: MEDICAID | Admitting: Family Medicine
